# Patient Record
Sex: Female | Born: 1980 | Race: White | Hispanic: No | Marital: Single | Smoking: Current every day smoker
Health system: Southern US, Community
[De-identification: ages and names within clinical notes are randomized; demographics above are authoritative.]

---

## 2016-07-06 ENCOUNTER — Emergency Department (HOSPITAL_COMMUNITY)
Admission: EM | Admit: 2016-07-06 | Discharge: 2016-07-07 | Disposition: A | Discharge status: Home / Self Care | Payer: Self-pay | Attending: Emergency Medicine | Admitting: Emergency Medicine

## 2016-07-06 ENCOUNTER — Encounter (HOSPITAL_COMMUNITY): Payer: Self-pay | Admitting: Emergency Medicine

## 2016-07-06 DIAGNOSIS — F1120 Opioid dependence, uncomplicated: Secondary | ICD-10-CM

## 2016-07-06 DIAGNOSIS — F17210 Nicotine dependence, cigarettes, uncomplicated: Secondary | ICD-10-CM | POA: Insufficient documentation

## 2016-07-06 DIAGNOSIS — Z79899 Other long term (current) drug therapy: Secondary | ICD-10-CM | POA: Insufficient documentation

## 2016-07-06 DIAGNOSIS — R45851 Suicidal ideations: Secondary | ICD-10-CM

## 2016-07-06 LAB — COMPREHENSIVE METABOLIC PANEL
ALK PHOS: 47 U/L (ref 38–126)
ALT: 17 U/L (ref 14–54)
AST: 24 U/L (ref 15–41)
Albumin: 3.7 g/dL (ref 3.5–5.0)
Anion gap: 4 — ABNORMAL LOW (ref 5–15)
BILIRUBIN TOTAL: 0.4 mg/dL (ref 0.3–1.2)
BUN: 11 mg/dL (ref 6–20)
CALCIUM: 8.7 mg/dL — AB (ref 8.9–10.3)
CO2: 27 mmol/L (ref 22–32)
CREATININE: 0.71 mg/dL (ref 0.44–1.00)
Chloride: 108 mmol/L (ref 101–111)
GFR calc Af Amer: >60 mL/min (ref >60)
Glucose, Bld: 130 mg/dL — ABNORMAL HIGH (ref 65–99)
Potassium: 3.7 mmol/L (ref 3.5–5.1)
Sodium: 139 mmol/L (ref 135–145)
TOTAL PROTEIN: 7.2 g/dL (ref 6.5–8.1)

## 2016-07-06 LAB — CBC
HEMATOCRIT: 35.8 % — AB (ref 36.0–46.0)
Hemoglobin: 11.6 g/dL — ABNORMAL LOW (ref 12.0–15.0)
MCH: 29.5 pg (ref 26.0–34.0)
MCHC: 32.4 g/dL (ref 30.0–36.0)
MCV: 91.1 fL (ref 78.0–100.0)
PLATELETS: 289 10*3/uL (ref 150–400)
RBC: 3.93 MIL/uL (ref 3.87–5.11)
RDW: 14.7 % (ref 11.5–15.5)
WBC: 8.5 10*3/uL (ref 4.0–10.5)

## 2016-07-06 LAB — ACETAMINOPHEN LEVEL: Acetaminophen (Tylenol), Serum: <10 ug/mL — ABNORMAL LOW (ref 10–30)

## 2016-07-06 LAB — SALICYLATE LEVEL: Salicylate Lvl: <4 mg/dL (ref 2.8–30.0)

## 2016-07-06 LAB — ETHANOL

## 2016-07-06 MED ORDER — ZOLPIDEM TARTRATE 5 MG PO TABS
5.0000 mg | ORAL_TABLET | Freq: Every evening | ORAL | Status: DC | PRN
  Administered 2016-07-06: 5 mg via ORAL
  Filled 2016-07-06: qty 1

## 2016-07-06 MED ORDER — ACETAMINOPHEN 325 MG PO TABS: 650.0000 mg | ORAL_TABLET | ORAL | Status: DC | PRN

## 2016-07-06 MED ORDER — ALUM & MAG HYDROXIDE-SIMETH 200-200-20 MG/5ML PO SUSP: 30.0000 mL | ORAL | Status: DC | PRN

## 2016-07-06 MED ORDER — IBUPROFEN 200 MG PO TABS: 600.0000 mg | ORAL_TABLET | Freq: Three times a day (TID) | ORAL | Status: DC | PRN

## 2016-07-06 MED ORDER — ONDANSETRON 4 MG PO TBDP
4.0000 mg | ORAL_TABLET | Freq: Once | ORAL | Status: AC
  Administered 2016-07-06: 4 mg via ORAL
  Filled 2016-07-06: qty 1

## 2016-07-06 MED ORDER — NICOTINE 21 MG/24HR TD PT24: 21.0000 mg | MEDICATED_PATCH | Freq: Every day | TRANSDERMAL | Status: DC

## 2016-07-06 MED ORDER — ONDANSETRON HCL 4 MG PO TABS: 4.0000 mg | ORAL_TABLET | Freq: Three times a day (TID) | ORAL | Status: DC | PRN

## 2016-07-06 MED ORDER — NALOXONE HCL 2 MG/2ML IJ SOSY
1.0000 mg | PREFILLED_SYRINGE | Freq: Once | INTRAMUSCULAR | Status: AC
  Administered 2016-07-06: 1 mg via INTRAMUSCULAR
  Filled 2016-07-06: qty 2

## 2016-07-06 NOTE — ED Notes (Signed)
Bed: [facility] Expected date:  Expected time:  Means of arrival:  Comments: IVC 

## 2016-07-06 NOTE — BH Assessment (Addendum)
Assessment Note  Yvonne Wagner is an 35 y.o. female that presents this date under IVC. Patient was in treatment at [facility] for a year program that patient had completed 8 months of as ordered by probation prior to relapsing on opiates this date. Patient denies all questions on the assessment and is drowsy finding it difficult to answer any of the questions. No prior inpatient admission and no history of MH medication management. Per IVC: "Patient has been completely incoherent twice in the past 24 hours. The respondent is cooking some type of opiate in pill form and is injecting herself. [facility] removed two syringes that had a trace of a substance that she had used to inject the opiates. She is argumentive and indignant when question. She is under court order. She is a danger to herself." This Clinical research associate contacted [employer] (releases signed) and spoke to program director Cindi Loreta Ave [phone] who informed this Clinical research associate that patient was found with a belt around her arm and was injecting a unknown substance thought to be opiates. Loreta Ave stated patient was slow to interact with her and Wagner contact [facility]. Loreta Ave also informed this Clinical research associate that state probation [phone] was contacted and informed officer on call that patient had been admitted to [facility]. To note patient has signed a release of information at [facility] for [employer] to "find out what is going on." This Clinical research associate contacted Merchandiser, retail on call to verify [employer]. The rest of the information for the purposes of this assessment was obtained from admission note: "The patient is an unreliable historian. The history is gathered from the owner of her program. The patient is at [facility] under court order. The patient was found around 4 AM in the bathroom with a belt and around her arm and a needle in her right before meals. She lives with 5 other women who are in rehabilitation in a halfway house. The owner states  that they had to continually wake her up and walk around because she was falling asleep and having respiratory depression. They left around 6 AM before called back because around 7 a.m. when she was again found passed out on the floor. Her counselor states that she is afraid she will harm herself because she stated to one of the other patients, "this is my last day on earth."  Because the patient violated the agreements of her stay in a halfway house, she has to be terminated from the program, which means she will need to go directly to prison. The counselor at her facility is afraid that if her IVC paperwork is overturned and she is discharged she will kill herself. Her probation officer is not available today because it is a federal holiday.The patient denies suicidal ideation, homicidal ideation. She ideation or audiovisual hallucinations. She is somnolent and tells me that the reason is because she "couldn't sleep and had bad dreams." The patient also gives a convoluted and very difficult to follow story about being paraphernalia in the house that was not hers. She uses a evasive language and does not answer questions directly and was probed. The patient of note has track marks in her right AC". Case was staffed with Shaune Pollack DNP who recommended patient be re-evaluated in the a.m.        Diagnosis: Substance Abuse Mood D/O, Opiate use severe  Past Medical History: History reviewed. No pertinent past medical history.  History reviewed. No pertinent surgical history.  Family History: No family history on file.  Social History:  reports that she has been smoking E-cigarettes.  She has never used smokeless tobacco. She reports that she does not drink alcohol or use drugs.  Additional Social History:  Alcohol / Drug Use Pain Medications: See MAR Prescriptions: See MAR Over the Counter: See MAR History of alcohol / drug use?: Yes Longest period of sobriety (when/how long): 8 months current Negative  Consequences of Use: Legal Withdrawal Symptoms:  (none noted) Substance #1 Name of Substance 1: Heroin 1 - Age of First Use: 23 1 - Amount (size/oz): 2 grams 1 - Frequency: daily 1 - Duration: Currently denies because she is in treatment for the last 8 months  1 - Last Use / Amount: 8 months ago  CIWA: CIWA-Ar BP: 128/84 Pulse Rate: 76 COWS:    Allergies:  Allergies  Allergen Reactions  . Codeine     unknown    Home Medications:  (Not in a hospital admission)  OB/GYN Status:  No LMP recorded (lmp unknown).  General Assessment Data [facility] of [facility]: [facility] Assessment: In system Is this a Tele or Face-to-Face Assessment?: Face-to-Face Is this an Initial Assessment or a Re-assessment for this encounter?: Initial Assessment Marital status: Single Maiden name: na Is patient pregnant?: No Pregnancy Status: No Living Arrangements: Other (Comment) (treatment facility) Can pt return to current living arrangement?: No (per IVC) Admission Status: Involuntary Is patient capable of signing voluntary admission?: Yes Referral Source: Other (treatment center) Insurance type: No  Medical Screening Exam [facility] Walk-in ONLY) Medical Exam completed: Yes  Crisis Care Plan Living Arrangements: Other (Comment) (treatment facility) Legal Guardian:  (na) Name of Psychiatrist: None Name of Therapist: None  Education Status Is patient currently in school?: No Current Grade: na Highest grade of school patient has completed: 12 Name of school: na Contact person: na  Risk to self with the past 6 months Suicidal Ideation: No Has patient been a risk to self within the past 6 months prior to admission? : No Suicidal Intent: No Has patient had any suicidal intent within the past 6 months prior to admission? : No Is patient at risk for suicide?: No Suicidal Plan?: No Has patient had any suicidal plan within the past 6 months prior to admission? : No Access to Means: No What  has been your use of drugs/alcohol within the last 12 months?: past use Previous Attempts/Gestures: No How many times?: 0 Other Self Harm Risks: none Triggers for Past Attempts: Unknown Intentional Self Injurious Behavior: None Family Suicide History: No Recent stressful life event(s): Other (Comment) (pt is in SA treatment) Persecutory voices/beliefs?: No Depression: No Depression Symptoms:  (na) Substance abuse history and/or treatment for substance abuse?: Yes Suicide prevention information given to non-admitted patients: Not applicable  Risk to Others within the past 6 months Homicidal Ideation: No Does patient have any lifetime risk of violence toward others beyond the six months prior to admission? : No Thoughts of Harm to Others: No Current Homicidal Intent: No Current Homicidal Plan: No Access to Homicidal Means: No Identified Victim: na History of harm to others?: No Assessment of Violence: None Noted Violent Behavior Description: na Does patient have access to weapons?: No Criminal Charges Pending?: No Does patient have a court date: No Is patient on probation?: Yes  Psychosis Hallucinations: None noted Delusions: None noted  Mental Status Report Appearance/Hygiene: Unremarkable Eye Contact: Fair Motor Activity: Freedom of movement Speech: Soft Level of Consciousness: Drowsy Mood: Other (Comment) (very sleepy does not respond at times) Affect:  Labile Anxiety Level: Moderate Thought Processes: Coherent, Relevant Judgement: Partial Orientation: Person, Place, Time Obsessive Compulsive Thoughts/Behaviors: None  Cognitive Functioning Concentration: Decreased Memory: Recent Impaired IQ: Average Insight: Poor Impulse Control: Poor Appetite: Fair Weight Loss: 0 Weight Gain: 0 Sleep: Decreased Total Hours of Sleep: 6 Vegetative Symptoms: None  ADLScreening [facility] Assessment Services) Patient's cognitive ability adequate to safely complete daily  activities?: Yes Patient able to express need for assistance with ADLs?: Yes Independently performs ADLs?: Yes (appropriate for developmental age)  Prior Inpatient Therapy Prior Inpatient Therapy: Yes Prior Therapy Dates: 2017 Prior Therapy Facilty/Provider(s): [facility] Reason for Treatment: SA use  Prior Outpatient Therapy Prior Outpatient Therapy: Yes Prior Therapy Dates: 2015 [facility] Reason for Treatment: SA use Does patient have an ACCT team?: No Does patient have Intensive In-House Services?  : No Does patient have [facility] services? : No Does patient have P4CC services?: No  ADL Screening (condition at time of admission) Patient's cognitive ability adequate to safely complete daily activities?: Yes Is the patient deaf or have difficulty hearing?: No Does the patient have difficulty seeing, even when wearing glasses/contacts?: No Does the patient have difficulty concentrating, remembering, or making decisions?: No Patient able to express need for assistance with ADLs?: Yes Does the patient have difficulty dressing or bathing?: No Independently performs ADLs?: Yes (appropriate for developmental age) Does the patient have difficulty walking or climbing stairs?: No Weakness of Legs: None Weakness of Arms/Hands: None  Home Assistive Devices/Equipment Home Assistive Devices/Equipment: None  Therapy Consults (therapy consults require a physician order) PT Evaluation Needed: No OT Evalulation Needed: No SLP Evaluation Needed: No Abuse/Neglect Assessment (Assessment to be complete while patient is alone) Physical Abuse: Denies Verbal Abuse: Denies Sexual Abuse: Denies Exploitation of patient/patient's resources: Denies Self-Neglect: Denies Values / Beliefs Cultural Requests During Hospitalization: None Spiritual Requests During Hospitalization: None Consults Spiritual Care Consult Needed: No Social Work Consult Needed:  No Merchant navy officer (For Healthcare) Does patient have an advance directive?: No Would patient like information on creating an advanced directive?: No - patient declined information (pt declined)    Additional Information 1:1 In Past 12 Months?: No CIRT Risk: No Elopement Risk: No Does patient have medical clearance?: Yes     Disposition: Case was staffed with Shaune Pollack DNP who recommended patient be re-evaluated in the a.m.  Disposition Initial Assessment Completed for this Encounter: Yes Disposition of Patient: Inpatient treatment program Type of inpatient treatment program: Adult Other disposition(s): Other (Comment) (re-evaluate in the a.m.)  On Site Evaluation by:   Reviewed with Physician:    Alfredia Ferguson 07/06/2016 4:05 PM

## 2016-07-06 NOTE — ED Notes (Signed)
Pt's affect is guarded. She is cooperative. Denies SI/HI, AVH. Her only expressed concerned is that some friends be allowed to visit her.

## 2016-07-06 NOTE — ED Provider Notes (Signed)
[facility] DEPT Provider Note   CSN: [id number] Arrival date & time: 07/06/16  1225     History   Chief Complaint Chief Complaint  Patient presents with  . IVC    HPI Yvonne Wagner is a 35 y.o. female y/o F with a pmh of polysubstance abuse.  The patient is an unreliable historian. The history is gathered from the owner of her program. [facility]. The patient was found around 4 AM in the bathroom with a belt and around her arm and a needle in her right before meals. He lives with 5 other women who are in rehabilitation in a halfway house. The owner states that they had to continually wake her up and walk around because she was falling asleep and having respiratory depression. The left around 6 AM before called back because around 7 AM she was again Found passed out on the floor. Her counselor states that she is afraid she will harm herself because she stated to one of the other patients, "this is my last day on earth."  Because of the patient violated the agreements of her stay in a halfway house, She has to be terminated from the program, which means she will need to go directly to prison.  The counselor at her facility is afraid that if her IVC paperwork is overturned and she is discharged She will kill herself.  Her probation officer is not available today because it is a federal holiday.The patient denies suicidal ideation, homicidal ideation. She ideation or audiovisual hallucinations. She is somnolent and tells me that the reason is because she "couldn't sleep and had bad dreams." The patient also gives a convoluted and very difficult to follow story about being paraphernalia in the house that was not hers. She uses a evasive language and does not answer questions directly and was probed. The patient of note has track marks in her right [facility]. She tells me this is from an attempt at blood draw. I asked the nurse tech here if she drew blood from  this area and she states that she did not.   HPI  History reviewed. No pertinent past medical history.  There are no active problems to display for this patient.   History reviewed. No pertinent surgical history.  OB History    No data available       Home Medications    Prior to Admission medications   Medication Sig Start Date End Date Taking? Authorizing Provider  norgestimate-ethinyl estradiol (ORTHO-CYCLEN,SPRINTEC,PREVIFEM) 0.25-35 MG-MCG tablet Take 1 tablet by mouth daily.   Yes Historical Provider, MD    Family History No family history on file.  Social History Social History  Substance Use Topics  . Smoking status: Current Every Day Smoker    Types: E-cigarettes  . Smokeless tobacco: Never Used  . Alcohol use No     Allergies   Codeine   Review of Systems Review of Systems  Ten systems reviewed and are negative for acute change, except as noted in the HPI.    Physical Exam Updated Vital Signs BP 128/86 (BP Location: Left Arm)   Pulse 89   Temp 98.7 F (37.1 C) (Oral)   Ht 5\' 2"  (1.575 m)   Wt 59 kg   LMP  (LMP Unknown)   SpO2 100%   BMI 23.78 kg/m   Physical Exam  Constitutional: She is oriented to person, place, and time. She appears well-developed and well-nourished. No distress.  HENT:  Head: Normocephalic and atraumatic.  Eyes: Conjunctivae are normal. No scleral icterus.  Neck: Normal range of motion.  Cardiovascular: Normal rate, regular rhythm and normal heart sounds.  Exam reveals no gallop and no friction rub.   No murmur heard. Pulmonary/Chest: Effort normal and breath sounds normal. No respiratory distress.  Abdominal: Soft. Bowel sounds are normal. She exhibits no distension and no mass. There is no tenderness. There is no guarding.  Neurological: She is oriented to person, place, and time.  SOMNOLENT BUT EASILY AROUSED  Skin: Skin is warm and dry. She is not diaphoretic.  TRACK MARKS IN THE R AC     ED Treatments /  Results  Labs (all labs ordered are listed, but only abnormal results are displayed) Labs Reviewed  COMPREHENSIVE METABOLIC PANEL - Abnormal; Notable for the following:       Result Value   Glucose, Bld 130 (*)    Calcium 8.7 (*)    Anion gap 4 (*)    All other components within normal limits  ACETAMINOPHEN LEVEL - Abnormal; Notable for the following:    Acetaminophen (Tylenol), Serum <10 (*)    All other components within normal limits  CBC - Abnormal; Notable for the following:    Hemoglobin 11.6 (*)    HCT 35.8 (*)    All other components within normal limits  ETHANOL  SALICYLATE LEVEL  URINE RAPID DRUG SCREEN, HOSP PERFORMED  I-STAT BETA HCG BLOOD, ED ([facility], [facility], [facility] ONLY)  POC URINE PREG, ED    EKG  EKG Interpretation None       Radiology No results found.  Procedures Procedures (including critical care time)  Medications Ordered in ED Medications  ondansetron (ZOFRAN-ODT) disintegrating tablet 4 mg (not administered)  naloxone (NARCAN) injection 1 mg (not administered)  alum & mag hydroxide-simeth (MAALOX/MYLANTA) 200-200-20 MG/5ML suspension 30 mL (not administered)  ondansetron (ZOFRAN) tablet 4 mg (not administered)  ibuprofen (ADVIL,MOTRIN) tablet 600 mg (not administered)  acetaminophen (TYLENOL) tablet 650 mg (not administered)  nicotine (NICODERM CQ - dosed in mg/24 hours) patch 21 mg (not administered)  zolpidem (AMBIEN) tablet 5 mg (not administered)     Initial Impression / Assessment and Plan / ED Course  I have reviewed the triage vital signs and the nursing notes.  Pertinent labs & imaging results that were available during my care of the patient were reviewed by me and considered in my medical decision making (see chart for details).  Clinical Course    Patient appears medically clear for psych eval.  Final Clinical Impressions(s) / ED Diagnoses   Final diagnoses:  None    New Prescriptions New Prescriptions   No medications on file       Arthor Captain, PA-C 07/06/16 1512    Melene Plan, DO 07/06/16 1515

## 2016-07-06 NOTE — ED Notes (Signed)
Pt sleeping at present, no distress noted, calm & cooperative, A&O x 3.  Monitoring for safety, Q 15 min checks in effect.

## 2016-07-06 NOTE — BH Assessment (Addendum)
[facility] Assessment Progress Note   Case was staffed with Lord DNP who recommended patient be re-evaluated in the a.m.    

## 2016-07-06 NOTE — ED Notes (Signed)
Yvonne Wagner SAPU staff given report; pt belongings given to [facility] staff.

## 2016-07-06 NOTE — ED Triage Notes (Addendum)
Pt IVC by landlord related to opiate use stating "she is a danger to herself." Pt denies SI/HI or A/VH. Landlord en route to talk to ED staff.

## 2016-07-07 DIAGNOSIS — F1120 Opioid dependence, uncomplicated: Secondary | ICD-10-CM

## 2016-07-07 LAB — RAPID URINE DRUG SCREEN, HOSP PERFORMED
AMPHETAMINES: NOT DETECTED
Barbiturates: NOT DETECTED
Benzodiazepines: POSITIVE — AB
COCAINE: NOT DETECTED
OPIATES: POSITIVE — AB
Tetrahydrocannabinol: NOT DETECTED

## 2016-07-07 NOTE — [facility] Suicide Risk Assessment (Signed)
Suicide Risk Assessment  Discharge Assessment   [facility]   Principal Problem: Opiate dependence [facility](HCC) Discharge Diagnoses:  Patient Active Problem List   Diagnosis Date Noted  . Opiate dependence (HCC) [F11.20] 07/07/2016    Priority: High    Total Time spent with patient: 45 minutes  Musculoskeletal: Strength & Muscle Tone: within normal limits Gait & Station: normal Patient leans: N/A  Psychiatric Specialty Exam: Physical Exam  Constitutional: She is oriented to person, place, and time. She appears well-developed and well-nourished.  HENT:  Head: Normocephalic.  Neck: Normal range of motion.  Respiratory: Effort normal.  Musculoskeletal: Normal range of motion.  Neurological: She is alert and oriented to person, place, and time.  Skin: Skin is warm and dry.  Psychiatric: She has a normal mood and affect. Her speech is normal and behavior is normal. Judgment and thought content normal. Cognition and memory are normal.    Review of Systems  Constitutional: Negative.   HENT: Negative.   Eyes: Negative.   Respiratory: Negative.   Cardiovascular: Negative.   Gastrointestinal: Negative.   Genitourinary: Negative.   Musculoskeletal: Negative.   Skin: Negative.   Neurological: Negative.   Endo/Heme/Allergies: Negative.   Psychiatric/Behavioral: Positive for substance abuse.    Blood pressure 99/59, pulse 66, temperature 98.2 F (36.8 C), temperature source Oral, resp. rate 16, height 5\' 2"  (1.575 m), weight 59 kg (130 lb), SpO2 100 %.Body mass index is 23.78 kg/m.  General Appearance: Casual  Eye Contact:  Good  Speech:  Normal Rate  Volume:  Normal  Mood:  Euthymic  Affect:  Congruent  Thought Process:  Coherent and Descriptions of Associations: Intact  Orientation:  Full (Time, Place, and Person)  Thought Content:  WDL  Suicidal Thoughts:  No  Homicidal Thoughts:  No  Memory:  Immediate;   Good Recent;   Good Remote;   Good   Judgement:  Fair  Insight:  Fair  Psychomotor Activity:  Normal  Concentration:  Concentration: Good and Attention Span: Good  Recall:  Good  Fund of Knowledge:  Fair  Language:  Good  Akathisia:  No  Handed:  Right  AIMS (if indicated):     Assets:  Leisure Time Physical Health Resilience  ADL's:  Intact  Cognition:  WNL  Sleep:      Mental Status Per Nursing Assessment::   On Admission:   opiate abuse  Demographic Factors:  Caucasian  Loss Factors: Legal issues  Historical Factors: NA  Risk Reduction Factors:   Sense of responsibility to family, Living with another person, especially a relative, Positive social support and Positive therapeutic relationship  Continued Clinical Symptoms:  None   Cognitive Features That Contribute To Risk:  None    Suicide Risk:  Minimal: No identifiable suicidal ideation.  Patients presenting with no risk factors but with morbid ruminations; may be classified as minimal risk based on the severity of the depressive symptoms    Plan Of Care/Follow-up recommendations:  Activity:  as tolerated Diet:  heart healthy diet  LORD, JAMISON, NP 07/07/2016, 9:55 AM

## 2016-07-07 NOTE — ED Notes (Signed)
Pt discharged ambulatory with bus pass.  Pt was calm and cooperative and denied S/I and H/I.  All belongings were returned to pt.

## 2016-07-07 NOTE — Consult Note (Addendum)
[facility] Consult   Reason for Consult:  Heroin abuse Referring Physician:  EDP Patient Identification: Yvonne Wagner MRN:  [id number] Principal Diagnosis: Opiate dependence ([place]) Diagnosis:   Patient Active Problem List   Diagnosis Date Noted  . Opiate dependence ([place]) [F11.20] 07/07/2016    Priority: High    Total Time spent with patient: 45 minutes  Subjective:   Yvonne Wagner is a 35 y.o. female patient does not warrant admission.  HPI:  35 yo female who presented to the ED under IVC for abusing heroin at her "healing house."  She has been cooperative, calm, and pleasant.  [name] denies suicidal/homicidal ideations, hallucinations, and alcohol abuse.  Denies withdrawal symptoms and regrets her actions.  She had been clean for 8 months and someone new to the house brought drugs and she used the heroin.  Not interested in rehab at this time.  Past Psychiatric History: substance abuse  Risk to Self: Suicidal Ideation: No Suicidal Intent: No Is patient at risk for suicide?: No Suicidal Plan?: No Access to Means: No What has been your use of drugs/alcohol within the last 12 months?: past use How many times?: 0 Other Self Harm Risks: none Triggers for Past Attempts: Unknown Intentional Self Injurious Behavior: None Risk to Others: Homicidal Ideation: No Thoughts of Harm to Others: No Current Homicidal Intent: No Current Homicidal Plan: No Access to Homicidal Means: No Identified Victim: na History of harm to others?: No Assessment of Violence: None Noted Violent Behavior Description: na Does patient have access to weapons?: No Criminal Charges Pending?: No Does patient have a court date: No Prior Inpatient Therapy: Prior Inpatient Therapy: Yes Prior Therapy Dates: 2017 Prior Therapy Facilty/Provider(s): [facility] Reason for Treatment: SA use Prior Outpatient Therapy: Prior Outpatient Therapy: Yes Prior Therapy Dates: 2015 Prior Therapy  Facilty/Provider(s): [facility] Reason for Treatment: SA use Does patient have an ACCT team?: No Does patient have Intensive In-House Services?  : No Does patient have [facility] services? : No Does patient have P4[facility] services?: No  Past Medical History: History reviewed. No pertinent past medical history. History reviewed. No pertinent surgical history. Family History: No family history on file. Family Psychiatric  History: none Social History:  History  Alcohol Use No     History  Drug Use No    Social History   Social History  . Marital status: Single    Spouse name: N/A  . Number of children: N/A  . Years of education: N/A   Social History Main Topics  . Smoking status: Current Every Day Smoker    Types: E-cigarettes  . Smokeless tobacco: Never Used  . Alcohol use No  . Drug use: No  . Sexual activity: Not Asked   Other Topics Concern  . None   Social History Narrative  . None   Additional Social History:    Allergies:   Allergies  Allergen Reactions  . Codeine     unknown    Labs:  Results for orders placed or performed during the hospital encounter of 07/06/16 (from the past 48 hour(s))  Comprehensive metabolic panel     Status: Abnormal   Collection Time: 07/06/16 12:59 PM  Result Value Ref Range   Sodium 139 135 - 145 mmol/L   Potassium 3.7 3.5 - 5.1 mmol/L   Chloride 108 101 - 111 mmol/L   CO2 27 22 - 32 mmol/L   Glucose, Bld 130 (H) 65 - 99 mg/dL   BUN 11 6 - 20 mg/dL  Creatinine, Ser 0.71 0.44 - 1.00 mg/dL   Calcium 8.7 (L) 8.9 - 10.3 mg/dL   Total Protein 7.2 6.5 - 8.1 g/dL   Albumin 3.7 3.5 - 5.0 g/dL   AST 24 15 - 41 U/L   ALT 17 14 - 54 U/L   Alkaline Phosphatase 47 38 - 126 U/L   Total Bilirubin 0.4 0.3 - 1.2 mg/dL   GFR calc non Af Amer >60 >60 mL/min   GFR calc Af Amer >60 >60 mL/min    Comment: (NOTE) The eGFR has been calculated using the CKD EPI equation. This calculation has not been validated in all clinical  situations. eGFR's persistently <60 mL/min signify possible Chronic Kidney Disease.    Anion gap 4 (L) 5 - 15  cbc     Status: Abnormal   Collection Time: 07/06/16 12:59 PM  Result Value Ref Range   WBC 8.5 4.0 - 10.5 K/uL   RBC 3.93 3.87 - 5.11 MIL/uL   Hemoglobin 11.6 (L) 12.0 - 15.0 g/dL   HCT 91.1 (L) 77.0 - 19.3 %   MCV 91.1 78.0 - 100.0 fL   MCH 29.5 26.0 - 34.0 pg   MCHC 32.4 30.0 - 36.0 g/dL   RDW 96.6 57.8 - 14.0 %   Platelets 289 150 - 400 K/uL  Ethanol     Status: None   Collection Time: 07/06/16  1:10 PM  Result Value Ref Range   Alcohol, Ethyl (B) <5 <5 mg/dL    Comment:        LOWEST DETECTABLE LIMIT FOR SERUM ALCOHOL IS 5 mg/dL FOR MEDICAL PURPOSES ONLY   Salicylate level     Status: None   Collection Time: 07/06/16  1:10 PM  Result Value Ref Range   Salicylate Lvl <4.0 2.8 - 30.0 mg/dL  Acetaminophen level     Status: Abnormal   Collection Time: 07/06/16  1:10 PM  Result Value Ref Range   Acetaminophen (Tylenol), Serum <10 (L) 10 - 30 ug/mL    Comment:        THERAPEUTIC CONCENTRATIONS VARY SIGNIFICANTLY. A RANGE OF 10-30 ug/mL MAY BE AN EFFECTIVE CONCENTRATION FOR MANY PATIENTS. HOWEVER, SOME ARE BEST TREATED AT CONCENTRATIONS OUTSIDE THIS RANGE. ACETAMINOPHEN CONCENTRATIONS >150 ug/mL AT 4 HOURS AFTER INGESTION AND >50 ug/mL AT 12 HOURS AFTER INGESTION ARE OFTEN ASSOCIATED WITH TOXIC REACTIONS.   Rapid urine drug screen (hospital performed)     Status: Abnormal   Collection Time: 07/07/16  9:16 AM  Result Value Ref Range   Opiates POSITIVE (A) NONE DETECTED   Cocaine NONE DETECTED NONE DETECTED   Benzodiazepines POSITIVE (A) NONE DETECTED   Amphetamines NONE DETECTED NONE DETECTED   Tetrahydrocannabinol NONE DETECTED NONE DETECTED   Barbiturates NONE DETECTED NONE DETECTED    Comment:        DRUG SCREEN FOR MEDICAL PURPOSES ONLY.  IF CONFIRMATION IS NEEDED FOR ANY PURPOSE, NOTIFY LAB WITHIN 5 DAYS.        LOWEST DETECTABLE LIMITS FOR  URINE DRUG SCREEN Drug Class       Cutoff (ng/mL) Amphetamine      1000 Barbiturate      200 Benzodiazepine   200 Tricyclics       300 Opiates          300 Cocaine          300 THC              50     Current Facility-Administered Medications  Medication  Dose Route Frequency Provider Last Rate Last Dose  . acetaminophen (TYLENOL) tablet 650 mg  650 mg Oral Q4H PRN Margarita Mail, PA-C      . alum & mag hydroxide-simeth (MAALOX/MYLANTA) 200-200-20 MG/5ML suspension 30 mL  30 mL Oral PRN Margarita Mail, PA-C      . ibuprofen (ADVIL,MOTRIN) tablet 600 mg  600 mg Oral Q8H PRN Margarita Mail, PA-C      . nicotine (NICODERM CQ - dosed in mg/24 hours) patch 21 mg  21 mg Transdermal Daily Abigail Harris, PA-C      . ondansetron (ZOFRAN) tablet 4 mg  4 mg Oral Q8H PRN Margarita Mail, PA-C      . zolpidem (AMBIEN) tablet 5 mg  5 mg Oral QHS PRN Margarita Mail, PA-C   5 mg at 07/06/16 2149   Current Outpatient Prescriptions  Medication Sig Dispense Refill  . norgestimate-ethinyl estradiol (ORTHO-CYCLEN,SPRINTEC,PREVIFEM) 0.25-35 MG-MCG tablet Take 1 tablet by mouth daily.      Musculoskeletal: Strength & Muscle Tone: within normal limits Gait & Station: normal Patient leans: N/A  Psychiatric Specialty Exam: Physical Exam  Constitutional: She is oriented to person, place, and time. She appears well-developed and well-nourished.  HENT:  Head: Normocephalic.  Neck: Normal range of motion.  Respiratory: Effort normal.  Musculoskeletal: Normal range of motion.  Neurological: She is alert and oriented to person, place, and time.  Skin: Skin is warm and dry.  Psychiatric: She has a normal mood and affect. Her speech is normal and behavior is normal. Judgment and thought content normal. Cognition and memory are normal.    Review of Systems  Constitutional: Negative.   HENT: Negative.   Eyes: Negative.   Respiratory: Negative.   Cardiovascular: Negative.   Gastrointestinal: Negative.    Genitourinary: Negative.   Musculoskeletal: Negative.   Skin: Negative.   Neurological: Negative.   Endo/Heme/Allergies: Negative.   Psychiatric/Behavioral: Positive for substance abuse.    Blood pressure 99/59, pulse 66, temperature 98.2 F (36.8 C), temperature source Oral, resp. rate 16, height '5\' 2"'$  (1.575 m), weight 59 kg (130 lb), SpO2 100 %.Body mass index is 23.78 kg/m.  General Appearance: Casual  Eye Contact:  Good  Speech:  Normal Rate  Volume:  Normal  Mood:  Euthymic  Affect:  Congruent  Thought Process:  Coherent and Descriptions of Associations: Intact  Orientation:  Full (Time, Place, and Person)  Thought Content:  WDL  Suicidal Thoughts:  No  Homicidal Thoughts:  No  Memory:  Immediate;   Good Recent;   Good Remote;   Good  Judgement:  Fair  Insight:  Fair  Psychomotor Activity:  Normal  Concentration:  Concentration: Good and Attention Span: Good  Recall:  Good  Fund of Knowledge:  Fair  Language:  Good  Akathisia:  No  Handed:  Right  AIMS (if indicated):     Assets:  Leisure Time Physical Health Resilience  ADL's:  Intact  Cognition:  WNL  Sleep:        Treatment Plan Summary: Daily contact with patient to assess and evaluate symptoms and progress in treatment, Medication management and Plan opiate dependence:  -Crisis stabilization -Medication management:  PRN medications provided -Individual and substance abuse counseling  Disposition: No evidence of imminent risk to self or others at present.    Waylan Boga, NP 07/07/2016 9:50 AM  Patient seen face-to-face for psychiatric evaluation, chart reviewed and case discussed with the physician extender and developed treatment plan. Reviewed the information documented and agree  with the treatment plan. Corena Pilgrim, MD

## 2016-12-11 ENCOUNTER — Encounter: Payer: Self-pay | Admitting: Family Medicine

## 2016-12-11 ENCOUNTER — Ambulatory Visit: Payer: Self-pay | Admitting: Family Medicine

## 2016-12-11 ENCOUNTER — Ambulatory Visit: Payer: Self-pay | Attending: Family Medicine | Admitting: Family Medicine

## 2016-12-11 VITALS — BP 130/82 | HR 89 | Temp 98.3°F | Resp 18 | Ht 62.0 in | Wt 150.6 lb

## 2016-12-11 DIAGNOSIS — R200 Anesthesia of skin: Secondary | ICD-10-CM | POA: Insufficient documentation

## 2016-12-11 DIAGNOSIS — Z23 Encounter for immunization: Secondary | ICD-10-CM | POA: Insufficient documentation

## 2016-12-11 DIAGNOSIS — M62838 Other muscle spasm: Secondary | ICD-10-CM | POA: Insufficient documentation

## 2016-12-11 DIAGNOSIS — R202 Paresthesia of skin: Secondary | ICD-10-CM | POA: Insufficient documentation

## 2016-12-11 MED ORDER — CYCLOBENZAPRINE HCL 10 MG PO TABS: 10.0000 mg | ORAL_TABLET | Freq: Three times a day (TID) | ORAL | 0 refills | Status: AC | PRN

## 2016-12-11 MED ORDER — GABAPENTIN 300 MG PO CAPS: 300.0000 mg | ORAL_CAPSULE | Freq: Every day | ORAL | 2 refills | Status: DC

## 2016-12-11 MED FILL — CYCLOBENZAPRINE 10 MG TAB: 10 | 30 days supply | Qty: 60 | Fill #0

## 2016-12-11 MED FILL — GABAPENTIN 300 MG CAPSULE: 300 | 30 days supply | Qty: 30 | Fill #0

## 2016-12-11 NOTE — Patient Instructions (Addendum)
Gabapentin capsules or tablets What is this medicine? GABAPENTIN (GA ba pen tin) is used to control partial seizures in adults with epilepsy. It is also used to treat certain types of nerve pain. This medicine may be used for other purposes; ask your health care provider or pharmacist if you have questions. COMMON BRAND NAME(S): Active-PAC with Gabapentin, Gabarone, Neurontin What should I tell my health care provider before I take this medicine? They need to know if you have any of these conditions: -kidney disease -suicidal thoughts, plans, or attempt; a previous suicide attempt by you or a family member -an unusual or allergic reaction to gabapentin, other medicines, foods, dyes, or preservatives -pregnant or trying to get pregnant -breast-feeding How should I use this medicine? Take this medicine by mouth with a glass of water. Follow the directions on the prescription label. You can take it with or without food. If it upsets your stomach, take it with food.Take your medicine at regular intervals. Do not take it more often than directed. Do not stop taking except on your doctor's advice. If you are directed to break the 600 or 800 mg tablets in half as part of your dose, the extra half tablet should be used for the next dose. If you have not used the extra half tablet within 28 days, it should be thrown away. A special MedGuide will be given to you by the pharmacist with each prescription and refill. Be sure to read this information carefully each time. Talk to your pediatrician regarding the use of this medicine in children. Special care may be needed. Overdosage: If you think you have taken too much of this medicine contact a poison control center or emergency room at once. NOTE: This medicine is only for you. Do not share this medicine with others. What if I miss a dose? If you miss a dose, take it as soon as you can. If it is almost time for your next dose, take only that dose. Do not  take double or extra doses. What may interact with this medicine? Do not take this medicine with any of the following medications: -other gabapentin products This medicine may also interact with the following medications: -alcohol -antacids -antihistamines for allergy, cough and cold -certain medicines for anxiety or sleep -certain medicines for depression or psychotic disturbances -homatropine; hydrocodone -naproxen -narcotic medicines (opiates) for pain -phenothiazines like chlorpromazine, mesoridazine, prochlorperazine, thioridazine This list may not describe all possible interactions. Give your health care provider a list of all the medicines, herbs, non-prescription drugs, or dietary supplements you use. Also tell them if you smoke, drink alcohol, or use illegal drugs. Some items may interact with your medicine. What should I watch for while using this medicine? Visit your doctor or health care professional for regular checks on your progress. You may want to keep a record at home of how you feel your condition is responding to treatment. You may want to share this information with your doctor or health care professional at each visit. You should contact your doctor or health care professional if your seizures get worse or if you have any new types of seizures. Do not stop taking this medicine or any of your seizure medicines unless instructed by your doctor or health care professional. Stopping your medicine suddenly can increase your seizures or their severity. Wear a medical identification bracelet or chain if you are taking this medicine for seizures, and carry a card that lists all your medications. You may get drowsy, dizzy,   or have blurred vision. Do not drive, use machinery, or do anything that needs mental alertness until you know how this medicine affects you. To reduce dizzy or fainting spells, do not sit or stand up quickly, especially if you are an older patient. Alcohol can  increase drowsiness and dizziness. Avoid alcoholic drinks. Your mouth may get dry. Chewing sugarless gum or sucking hard candy, and drinking plenty of water will help. The use of this medicine may increase the chance of suicidal thoughts or actions. Pay special attention to how you are responding while on this medicine. Any worsening of mood, or thoughts of suicide or dying should be reported to your health care professional right away. Women who become pregnant while using this medicine may enroll in the [place] American Antiepileptic Drug Pregnancy Registry by calling [phone]. This registry collects information about the safety of antiepileptic drug use during pregnancy. What side effects may I notice from receiving this medicine? Side effects that you should report to your doctor or health care professional as soon as possible: -allergic reactions like skin rash, itching or hives, swelling of the face, lips, or tongue -worsening of mood, thoughts or actions of suicide or dying Side effects that usually do not require medical attention (report to your doctor or health care professional if they continue or are bothersome): -constipation -difficulty walking or controlling muscle movements -dizziness -nausea -slurred speech -tiredness -tremors -weight gain This list may not describe all possible side effects. Call your doctor for medical advice about side effects. You may report side effects to FDA at 1-800-FDA-1088. Where should I keep my medicine? Keep out of reach of children. This medicine may cause accidental overdose and death if it taken by other adults, children, or pets. Mix any unused medicine with a substance like cat litter or coffee grounds. Then throw the medicine away in a sealed container like a sealed bag or a coffee can with a lid. Do not use the medicine after the expiration date. Store at room temperature between 15 and 30 degrees C (59 and 86 degrees F). NOTE: This  sheet is a summary. It may not cover all possible information. If you have questions about this medicine, talk to your doctor, pharmacist, or health care provider.  2017 Elsevier/Gold Standard (2013-12-15 15:26:50)   Musculoskeletal Pain Musculoskeletal pain is muscle and bone aches and pains. This pain can occur in any part of the body. Follow these instructions at home:  Only take medicines for pain, discomfort, or fever as told by your health care provider.  You may continue all activities unless the activities cause more pain. When the pain lessens, slowly resume normal activities. Gradually increase the intensity and duration of the activities or exercise.  During periods of severe pain, bed rest may be helpful. Lie or sit in any position that is comfortable, but get out of bed and walk around at least every several hours.  If directed, put ice on the injured area.  Put ice in a plastic bag.  Place a towel between your skin and the bag.  Leave the ice on for 20 minutes, 2-3 times a day. Contact a health care provider if:  Your pain is getting worse.  Your pain is not relieved with medicines.  You lose function in the area of the pain if the pain is in your arms, legs, or neck. This information is not intended to replace advice given to you by your health care provider. Make sure you discuss any questions  you have with your health care provider. Document Released: 10/19/2005 Document Revised: 03/31/2016 Document Reviewed: 06/23/2013 Elsevier Interactive Patient Education  2017 [employer]
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
## 2016-12-14 ENCOUNTER — Emergency Department (HOSPITAL_BASED_OUTPATIENT_CLINIC_OR_DEPARTMENT_OTHER): Payer: Self-pay

## 2016-12-14 ENCOUNTER — Emergency Department (HOSPITAL_BASED_OUTPATIENT_CLINIC_OR_DEPARTMENT_OTHER)
Admission: EM | Admit: 2016-12-14 | Discharge: 2016-12-14 | Disposition: A | Discharge status: Home / Self Care | Payer: Self-pay | Attending: Emergency Medicine | Admitting: Emergency Medicine

## 2016-12-14 ENCOUNTER — Encounter (HOSPITAL_BASED_OUTPATIENT_CLINIC_OR_DEPARTMENT_OTHER): Payer: Self-pay | Admitting: *Deleted

## 2016-12-14 DIAGNOSIS — F17210 Nicotine dependence, cigarettes, uncomplicated: Secondary | ICD-10-CM | POA: Insufficient documentation

## 2016-12-14 DIAGNOSIS — J101 Influenza due to other identified influenza virus with other respiratory manifestations: Secondary | ICD-10-CM | POA: Insufficient documentation

## 2016-12-14 LAB — URINALYSIS, ROUTINE W REFLEX MICROSCOPIC
Bilirubin Urine: NEGATIVE
GLUCOSE, UA: NEGATIVE mg/dL
Ketones, ur: NEGATIVE mg/dL
LEUKOCYTES UA: NEGATIVE
Nitrite: NEGATIVE
PROTEIN: NEGATIVE mg/dL
SPECIFIC GRAVITY, URINE: 1.006 (ref 1.005–1.030)
pH: 6.5 (ref 5.0–8.0)

## 2016-12-14 LAB — URINALYSIS, MICROSCOPIC (REFLEX)

## 2016-12-14 LAB — I-STAT CG4 LACTIC ACID, ED: LACTIC ACID, VENOUS: 1.73 mmol/L (ref 0.5–1.9)

## 2016-12-14 MED ORDER — ACETAMINOPHEN 500 MG PO TABS
1000.0000 mg | ORAL_TABLET | Freq: Once | ORAL | Status: AC
  Administered 2016-12-14: 1000 mg via ORAL
  Filled 2016-12-14: qty 2

## 2016-12-14 MED ORDER — IBUPROFEN 800 MG PO TABS
800.0000 mg | ORAL_TABLET | Freq: Once | ORAL | Status: AC
  Administered 2016-12-14: 800 mg via ORAL
  Filled 2016-12-14: qty 1

## 2016-12-14 NOTE — ED Triage Notes (Signed)
Fever, cough, body aches, headache x 2 days. She took Ibuprofen 800mg  an hour ago.
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
## 2016-12-15 ENCOUNTER — Telehealth: Payer: Self-pay | Admitting: Family Medicine

## 2016-12-15 ENCOUNTER — Other Ambulatory Visit: Payer: Self-pay | Admitting: Family Medicine

## 2016-12-15 DIAGNOSIS — R202 Paresthesia of skin: Principal | ICD-10-CM

## 2016-12-15 DIAGNOSIS — R200 Anesthesia of skin: Secondary | ICD-10-CM

## 2016-12-15 MED ORDER — GABAPENTIN 300 MG PO CAPS: 300.0000 mg | ORAL_CAPSULE | Freq: Two times a day (BID) | ORAL | 1 refills | Status: DC

## 2016-12-15 MED FILL — GABAPENTIN 300 MG CAPSULE: 300 | 30 days supply | Qty: 60 | Fill #0

## 2016-12-15 NOTE — Telephone Encounter (Signed)
Pt. Called requesting to speak with her nurse. Pt. Was prescribed gabapentin (NEURONTIN) 300 MG capsule  And cyclobenzaprine (FLEXERIL) 10 MG tablet on her last OV. Pt. Has concerns regarding her flexeril.  Please f/u with pt.

## 2016-12-15 NOTE — Telephone Encounter (Signed)
Patient stated that the gabapentin was told to take it once a day but she feel that its not helping through out the day like at 1 pm her symptoms comes back and if she takes more than 1 pill a day it wont last her supply, also the flexeril makes her drowsy/fatigue the patient does take it with food, so the patient don't know what to do  please advice?

## 2016-12-15 NOTE — Telephone Encounter (Signed)
CMA call to inform that the provider increase her dose of medication and to decrease her other medication  Patient was aware and understood

## 2016-12-15 NOTE — Telephone Encounter (Signed)
Gabapentin dose has been increased to 300 mg twice a day. Refills will be available. Flexeril is a muscle relaxant. It is a common side effect for muscle relaxants to cause drowsiness. Flexeril should be taken 3 times a day as needed. She can decrease the amount of times per day she takes it or she can take it daily as needed at bedtime.

## 2016-12-21 ENCOUNTER — Ambulatory Visit: Payer: Self-pay | Attending: Family Medicine

## 2016-12-29 ENCOUNTER — Encounter (HOSPITAL_COMMUNITY): Payer: Self-pay

## 2016-12-29 ENCOUNTER — Emergency Department (HOSPITAL_COMMUNITY)
Admission: EM | Admit: 2016-12-29 | Discharge: 2016-12-29 | Disposition: A | Discharge status: Home / Self Care | Payer: Self-pay | Attending: Emergency Medicine | Admitting: Emergency Medicine

## 2016-12-29 DIAGNOSIS — F17290 Nicotine dependence, other tobacco product, uncomplicated: Secondary | ICD-10-CM | POA: Insufficient documentation

## 2016-12-29 DIAGNOSIS — T402X1A Poisoning by other opioids, accidental (unintentional), initial encounter: Secondary | ICD-10-CM | POA: Insufficient documentation

## 2016-12-29 DIAGNOSIS — T40601A Poisoning by unspecified narcotics, accidental (unintentional), initial encounter: Secondary | ICD-10-CM

## 2016-12-29 LAB — I-STAT CHEM 8, ED
BUN: 4 mg/dL — ABNORMAL LOW (ref 6–20)
Calcium, Ion: 1.03 mmol/L — ABNORMAL LOW (ref 1.15–1.40)
Chloride: 105 mmol/L (ref 101–111)
Creatinine, Ser: 0.6 mg/dL (ref 0.44–1.00)
Glucose, Bld: 105 mg/dL — ABNORMAL HIGH (ref 65–99)
HCT: 37 % (ref 36.0–46.0)
Hemoglobin: 12.6 g/dL (ref 12.0–15.0)
Potassium: 3.6 mmol/L (ref 3.5–5.1)
Sodium: 142 mmol/L (ref 135–145)
TCO2: 24 mmol/L (ref 0–100)

## 2016-12-29 LAB — I-STAT BETA HCG BLOOD, ED (MC, WL, AP ONLY): I-stat hCG, quantitative: <5 m[IU]/mL

## 2016-12-29 NOTE — ED Notes (Signed)
Pt states blood was drawn by phlebotomy just not clicked off in computer yet.

## 2016-12-29 NOTE — ED Notes (Signed)
Pt ambulatory to restroom

## 2016-12-29 NOTE — ED Notes (Signed)
Pt stable, ambulatory, states understanding of discharge instructions 

## 2016-12-29 NOTE — ED Triage Notes (Signed)
Pt arrived via [facility] c/o overdose. 5 minutes of CPR performed by bystander.  Pt states she consumed unknown amount of "little blue pills".  EMS gave 0.5mg  Narcan.
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
## 2017-01-04 ENCOUNTER — Other Ambulatory Visit: Payer: Self-pay

## 2017-01-04 ENCOUNTER — Encounter: Payer: Self-pay | Admitting: Family Medicine

## 2017-01-04 ENCOUNTER — Ambulatory Visit: Payer: Self-pay | Attending: Family Medicine | Admitting: Family Medicine

## 2017-01-04 VITALS — BP 130/83 | HR 110 | Temp 98.1°F | Resp 18 | Ht 62.0 in | Wt 147.2 lb

## 2017-01-04 DIAGNOSIS — R0602 Shortness of breath: Secondary | ICD-10-CM | POA: Insufficient documentation

## 2017-01-04 DIAGNOSIS — X58XXXA Exposure to other specified factors, initial encounter: Secondary | ICD-10-CM | POA: Insufficient documentation

## 2017-01-04 DIAGNOSIS — F419 Anxiety disorder, unspecified: Secondary | ICD-10-CM

## 2017-01-04 DIAGNOSIS — F17200 Nicotine dependence, unspecified, uncomplicated: Secondary | ICD-10-CM | POA: Insufficient documentation

## 2017-01-04 DIAGNOSIS — Z87898 Personal history of other specified conditions: Secondary | ICD-10-CM

## 2017-01-04 DIAGNOSIS — I10 Essential (primary) hypertension: Secondary | ICD-10-CM | POA: Insufficient documentation

## 2017-01-04 DIAGNOSIS — T50991A Poisoning by other drugs, medicaments and biological substances, accidental (unintentional), initial encounter: Secondary | ICD-10-CM | POA: Insufficient documentation

## 2017-01-04 DIAGNOSIS — R002 Palpitations: Secondary | ICD-10-CM | POA: Insufficient documentation

## 2017-01-04 DIAGNOSIS — F17290 Nicotine dependence, other tobacco product, uncomplicated: Secondary | ICD-10-CM

## 2017-01-04 DIAGNOSIS — Z9189 Other specified personal risk factors, not elsewhere classified: Secondary | ICD-10-CM

## 2017-01-04 LAB — BASIC METABOLIC PANEL WITH GFR
BUN: 9 mg/dL (ref 7–25)
CALCIUM: 9.6 mg/dL (ref 8.6–10.2)
CO2: 23 mmol/L (ref 20–31)
Chloride: 105 mmol/L (ref 98–110)
Creat: 0.61 mg/dL (ref 0.50–1.10)
GLUCOSE: 96 mg/dL (ref 65–99)
Potassium: 4 mmol/L (ref 3.5–5.3)
Sodium: 139 mmol/L (ref 135–146)

## 2017-01-04 LAB — LIPID PANEL
CHOLESTEROL: 161 mg/dL (ref <200)
HDL: 42 mg/dL — AB (ref >50)
LDL Cholesterol: 86 mg/dL (ref <100)
Total CHOL/HDL Ratio: 3.8 Ratio (ref <5.0)
Triglycerides: 167 mg/dL — ABNORMAL HIGH (ref <150)
VLDL: 33 mg/dL — ABNORMAL HIGH (ref <30)

## 2017-01-04 MED ORDER — HYDROCHLOROTHIAZIDE 12.5 MG PO TABS: 12.5000 mg | ORAL_TABLET | Freq: Every day | ORAL | 2 refills | Status: DC

## 2017-01-04 MED FILL — ?HYDROCHLOROTHIAZIDE 12.5MG: 12.5 | 30 days supply | Qty: 30 | Fill #0

## 2017-01-04 NOTE — Progress Notes (Signed)
Subjective:  Patient ID: Yvonne Wagner, female    DOB: 02/19/1981  Age: 36 y.o. MRN: [id number]  CC: Establish Care   HPI Yvonne GoodnightCaseie Bou presents for   ED f/u overdose: Denies any SI/HI. Decline to speak with LCSW at this time. Reports regular counseling sessions at current drug treatment facility.Denies thoughts of harming herself or other   Intermitment SOB and palpitations: Started this week. Denies any CP. Reports shortness of breath begins in the middle of the night, or when she begins devotions in the morning. Current smoker reports using vapes with 0.3 mg of nicotine. She reports not being ready to quit at this time. BP also found to be elevated this visit. She is currently on oral contraceptives and refuses other birth control options at this time.   Outpatient Medications Prior to Visit  Medication Sig Dispense Refill  . gabapentin (NEURONTIN) 300 MG capsule Take 1 capsule (300 mg total) by mouth 2 (two) times daily. 60 capsule 1  . norgestimate-ethinyl estradiol (ORTHO-CYCLEN,SPRINTEC,PREVIFEM) 0.25-35 MG-MCG tablet Take 1 tablet by mouth daily.    . cyclobenzaprine (FLEXERIL) 10 MG tablet Take 1 tablet (10 mg total) by mouth 3 (three) times daily as needed for muscle spasms. (Patient not taking: Reported on 01/04/2017) 60 tablet 0   No facility-administered medications prior to visit.     ROS Review of Systems  Respiratory: Negative.   Cardiovascular: Negative.   Gastrointestinal: Negative.   Neurological: Negative.   Psychiatric/Behavioral: Negative.        History of opiate abuse.        Objective:  BP 130/83   Pulse (!) 110   Temp 98.1 F (36.7 C) (Oral)   Resp 18   Ht 5\' 2"  (1.575 m)   Wt 147 lb 3.2 oz (66.8 kg)   LMP 12/06/2016   SpO2 99%   BMI 26.92 kg/m   BP/Weight 01/04/2017 12/29/2016 12/14/2016  Systolic BP 130 131 140  Diastolic BP 83 75 96  Wt. (Lbs) 147.2 - 140  BMI 26.92 - 25.61     Physical Exam  Cardiovascular: Normal rate, regular  rhythm, normal heart sounds and intact distal pulses.   Pulmonary/Chest: Effort normal and breath sounds normal.  Abdominal: Soft. Bowel sounds are normal.  Skin: Skin is warm and dry.  Psychiatric: She expresses no homicidal and no suicidal ideation. She expresses no suicidal plans and no homicidal plans.  Nursing note and vitals reviewed.    Depression screen [facility] 2/9 01/04/2017 12/11/2016  Decreased Interest 0 0  Down, Depressed, Hopeless 0 0  PHQ - 2 Score 0 0  Altered sleeping 1 0  Tired, decreased energy 0 0  Change in appetite 0 0  Feeling bad or failure about yourself  0 0  Trouble concentrating 0 0  Moving slowly or fidgety/restless 0 0  Suicidal thoughts 0 0  PHQ-9 Score 1 0   GAD 7 : Generalized Anxiety Score 01/04/2017 12/11/2016  Nervous, Anxious, on Edge 0 0  Control/stop worrying 0 0  Worry too much - different things 0 0  Trouble relaxing 0 0  Restless 0 0  Easily annoyed or irritable 0 0  Afraid - awful might happen 0 0  Total GAD 7 Score 0 0     Assessment & Plan:   Problem List Items Addressed This Visit    None    Visit Diagnoses    History of drug overdose    -  Primary   Relevant Orders   EKG  12-Lead   History of palpitations       Relevant Orders   EKG 12-Lead   Essential hypertension       Relevant Medications   hydrochlorothiazide (HYDRODIURIL) 12.5 MG tablet   Other Relevant Orders   Microalbumin/Creatinine Ratio, Urine (Completed)   BASIC METABOLIC PANEL WITH GFR (Completed)   Lipid Panel (Completed)   Other tobacco product nicotine dependence, uncomplicated       Anxious mood             -Based on recent history of drug overdose and evaluation of symptoms , patient symptoms are very           likely to be attributed to anxiety.        -She declines speaking with LCSW.      Meds ordered this encounter  Medications  . hydrochlorothiazide (HYDRODIURIL) 12.5 MG tablet    Sig: Take 1 tablet (12.5 mg total) by mouth daily.    Dispense:  30  tablet    Refill:  2    Order Specific Question:   Supervising Provider    Answer:   Quentin Angst [id number]    Follow-up: Return in about 2 weeks (around 01/18/2017) for BP check with nurse.  Lizbeth Bark FNP

## 2017-01-04 NOTE — Progress Notes (Signed)
Patient is here for F/up ED opiate overdose  patient wants to see if she gets referral to a cardiology   Patient complains about heart beating harder

## 2017-01-04 NOTE — Patient Instructions (Addendum)
Come back in 2 week for BP check with nurse.  Hydrochlorothiazide, HCTZ capsules or tablets What is this medicine? HYDROCHLOROTHIAZIDE (hye droe klor oh THYE a zide) is a diuretic. It increases the amount of urine passed, which causes the body to lose salt and water. This medicine is used to treat high blood pressure. It is also reduces the swelling and water retention caused by various medical conditions, such as heart, liver, or kidney disease. This medicine may be used for other purposes; ask your health care provider or pharmacist if you have questions. COMMON BRAND NAME(S): Esidrix, Ezide, HydroDIURIL, Microzide, Oretic, Zide What should I tell my health care provider before I take this medicine? They need to know if you have any of these conditions: -diabetes -gout -immune system problems, like lupus -kidney disease or kidney stones -liver disease -pancreatitis -small amount of urine or difficulty passing urine -an unusual or allergic reaction to hydrochlorothiazide, sulfa drugs, other medicines, foods, dyes, or preservatives -pregnant or trying to get pregnant -breast-feeding How should I use this medicine? Take this medicine by mouth with a glass of water. Follow the directions on the prescription label. Take your medicine at regular intervals. Remember that you will need to pass urine frequently after taking this medicine. Do not take your doses at a time of day that will cause you problems. Do not stop taking your medicine unless your doctor tells you to. Talk to your pediatrician regarding the use of this medicine in children. Special care may be needed. Overdosage: If you think you have taken too much of this medicine contact a poison control center or emergency room at once. NOTE: This medicine is only for you. Do not share this medicine with others. What if I miss a dose? If you miss a dose, take it as soon as you can. If it is almost time for your next dose, take only that  dose. Do not take double or extra doses. What may interact with this medicine? -cholestyramine -colestipol -digoxin -dofetilide -lithium -medicines for blood pressure -medicines for diabetes -medicines that relax muscles for surgery -other diuretics -steroid medicines like prednisone or cortisone This list may not describe all possible interactions. Give your health care provider a list of all the medicines, herbs, non-prescription drugs, or dietary supplements you use. Also tell them if you smoke, drink alcohol, or use illegal drugs. Some items may interact with your medicine. What should I watch for while using this medicine? Visit your doctor or health care professional for regular checks on your progress. Check your blood pressure as directed. Ask your doctor or health care professional what your blood pressure should be and when you should contact him or her. You may need to be on a special diet while taking this medicine. Ask your doctor. Check with your doctor or health care professional if you get an attack of severe diarrhea, nausea and vomiting, or if you sweat a lot. The loss of too much body fluid can make it dangerous for you to take this medicine. You may get drowsy or dizzy. Do not drive, use machinery, or do anything that needs mental alertness until you know how this medicine affects you. Do not stand or sit up quickly, especially if you are an older patient. This reduces the risk of dizzy or fainting spells. Alcohol may interfere with the effect of this medicine. Avoid alcoholic drinks. This medicine may affect your blood sugar level. If you have diabetes, check with your doctor or health care  professional before changing the dose of your diabetic medicine. This medicine can make you more sensitive to the sun. Keep out of the sun. If you cannot avoid being in the sun, wear protective clothing and use sunscreen. Do not use sun lamps or tanning beds/booths. What side effects may I  notice from receiving this medicine? Side effects that you should report to your doctor or health care professional as soon as possible: -allergic reactions such as skin rash or itching, hives, swelling of the lips, mouth, tongue, or throat -changes in vision -chest pain -eye pain -fast or irregular heartbeat -feeling faint or lightheaded, falls -gout attack -muscle pain or cramps -pain or difficulty when passing urine -pain, tingling, numbness in the hands or feet -redness, blistering, peeling or loosening of the skin, including inside the mouth -unusually weak or tired Side effects that usually do not require medical attention (report to your doctor or health care professional if they continue or are bothersome): -change in sex drive or performance -dry mouth -headache -stomach upset This list may not describe all possible side effects. Call your doctor for medical advice about side effects. You may report side effects to FDA at 1-800-FDA-1088. Where should I keep my medicine? Keep out of the reach of children. Store at room temperature between 15 and 30 degrees C (59 and 86 degrees F). Do not freeze. Protect from light and moisture. Keep container closed tightly. Throw away any unused medicine after the expiration date. NOTE: This sheet is a summary. It may not cover all possible information. If you have questions about this medicine, talk to your doctor, pharmacist, or health care provider.  2018 Elsevier/Gold Standard (2010-06-13 12:57:37)

## 2017-01-05 LAB — MICROALBUMIN / CREATININE URINE RATIO
CREATININE, URINE: 122 mg/dL (ref 20–320)
Microalb Creat Ratio: 4 mcg/mg creat (ref <30)
Microalb, Ur: 0.5 mg/dL

## 2017-01-13 ENCOUNTER — Telehealth: Payer: Self-pay

## 2017-01-13 MED FILL — GABAPENTIN 300 MG CAPSULE: 300 | 30 days supply | Qty: 60 | Fill #1

## 2017-01-13 NOTE — Telephone Encounter (Signed)
-----   Message from Lizbeth BarkMandesia R Hairston, [place]NP sent at 01/12/2017  7:30 PM EDT ----- Microalbumin/creatinine ratio level was normal. This tests for protein in your urine that could indicate early signs of kidney damage. Kidney function normal Lipid levels were elevated. This can increase your risk of heart disease overtime. Start eating a diet low in saturated fat. Limit your intake of fried foods, red meats, and whole milk.  Begin exercising at least 3-5 times per week for 30 minutes.

## 2017-01-13 NOTE — Telephone Encounter (Signed)
CMA call to inform patient their results  Patient verify DOB  Patient was aware and understood

## 2017-01-18 ENCOUNTER — Other Ambulatory Visit: Payer: Self-pay | Admitting: Family Medicine

## 2017-01-18 ENCOUNTER — Telehealth: Payer: Self-pay | Admitting: *Deleted

## 2017-01-18 ENCOUNTER — Ambulatory Visit: Payer: Self-pay | Attending: Family Medicine | Admitting: *Deleted

## 2017-01-18 VITALS — BP 130/82

## 2017-01-18 DIAGNOSIS — I10 Essential (primary) hypertension: Secondary | ICD-10-CM

## 2017-01-18 NOTE — Progress Notes (Signed)
Pt here for f/u BP check. Pt denies chest pain, SOB, HA, new vison concerns, or generalized swelling. BP reading manually 130/82. Nurse visit will be routed to provider.Guy Francoravia Tamma Brigandi, RN, BSN

## 2017-01-18 NOTE — Telephone Encounter (Signed)
During nurse visit, pt states she feels that she needs to be back on the stronger dose of gabapentin.  She has been more active and has more nerve pain in left leg, not excluding a fall over the weekend. Pt denies pain in other areas r/t fall.  Before she moved here she states she was taking different dose. Unsure if this was a medication she needed to increase dose slowly. Please advise.

## 2017-01-19 ENCOUNTER — Other Ambulatory Visit: Payer: Self-pay | Admitting: Family Medicine

## 2017-01-19 DIAGNOSIS — R200 Anesthesia of skin: Secondary | ICD-10-CM

## 2017-01-19 DIAGNOSIS — I10 Essential (primary) hypertension: Secondary | ICD-10-CM

## 2017-01-19 DIAGNOSIS — R202 Paresthesia of skin: Principal | ICD-10-CM

## 2017-01-19 MED ORDER — GABAPENTIN 300 MG PO CAPS: 300.0000 mg | ORAL_CAPSULE | Freq: Three times a day (TID) | ORAL | 2 refills | Status: DC

## 2017-01-19 MED ORDER — HYDROCHLOROTHIAZIDE 25 MG PO TABS: 25.0000 mg | ORAL_TABLET | Freq: Every day | ORAL | 2 refills | Status: DC

## 2017-01-19 MED FILL — HYDROCHLOROTHIAZIDE 25 MG T: 25 | 30 days supply | Qty: 30 | Fill #0

## 2017-01-19 NOTE — Telephone Encounter (Signed)
Left message with [facility] personnel to return call. This is the number that is provided in chart. Pt may call back and speak to [place] or Joanna.

## 2017-01-19 NOTE — Telephone Encounter (Signed)
Pt aware of message, verbalized understanding.  Apt scheduled for patient to recheck BP in 2 weeks.

## 2017-01-19 NOTE — Telephone Encounter (Signed)
Increased gabapentin to TID. Increased hydrochlorothiazide (HTCZ) to 25 mg per day for better BP control. BP Goal less than 130/80. Recommend scheduling recheck again with RN in 2 weeks.

## 2017-01-21 MED FILL — GABAPENTIN 300 MG CAPSULE: 300 | 30 days supply | Qty: 90 | Fill #0

## 2017-02-03 ENCOUNTER — Ambulatory Visit: Payer: Self-pay | Attending: Family Medicine | Admitting: *Deleted

## 2017-02-03 VITALS — BP 110/70 | HR 99 | Resp 16

## 2017-02-03 DIAGNOSIS — I10 Essential (primary) hypertension: Secondary | ICD-10-CM

## 2017-02-03 NOTE — Progress Notes (Signed)
Pt here for f/u BP check. Pt denies chest pain, SOB, HA, new vison concerns, or generalized swelling.  BP taken manually in left arm, BP: 110/70 P: 99  Nurse visit will be routed to provider.Guy Franco, RN, BSN

## 2017-02-17 MED FILL — GABAPENTIN 300 MG CAPSULE: 300 | 30 days supply | Qty: 90 | Fill #1

## 2017-03-07 ENCOUNTER — Encounter (HOSPITAL_BASED_OUTPATIENT_CLINIC_OR_DEPARTMENT_OTHER): Payer: Self-pay | Admitting: *Deleted

## 2017-03-07 ENCOUNTER — Emergency Department (HOSPITAL_BASED_OUTPATIENT_CLINIC_OR_DEPARTMENT_OTHER): Payer: Self-pay

## 2017-03-07 ENCOUNTER — Emergency Department (HOSPITAL_BASED_OUTPATIENT_CLINIC_OR_DEPARTMENT_OTHER)
Admission: EM | Admit: 2017-03-07 | Discharge: 2017-03-07 | Disposition: A | Discharge status: Home / Self Care | Payer: Self-pay | Attending: Emergency Medicine | Admitting: Emergency Medicine

## 2017-03-07 DIAGNOSIS — Y929 Unspecified place or not applicable: Secondary | ICD-10-CM | POA: Insufficient documentation

## 2017-03-07 DIAGNOSIS — Y939 Activity, unspecified: Secondary | ICD-10-CM | POA: Insufficient documentation

## 2017-03-07 DIAGNOSIS — Y999 Unspecified external cause status: Secondary | ICD-10-CM | POA: Insufficient documentation

## 2017-03-07 DIAGNOSIS — W010XXA Fall on same level from slipping, tripping and stumbling without subsequent striking against object, initial encounter: Secondary | ICD-10-CM | POA: Insufficient documentation

## 2017-03-07 DIAGNOSIS — F17210 Nicotine dependence, cigarettes, uncomplicated: Secondary | ICD-10-CM | POA: Insufficient documentation

## 2017-03-07 DIAGNOSIS — S838X2A Sprain of other specified parts of left knee, initial encounter: Secondary | ICD-10-CM

## 2017-03-07 DIAGNOSIS — S86812A Strain of other muscle(s) and tendon(s) at lower leg level, left leg, initial encounter: Secondary | ICD-10-CM | POA: Insufficient documentation

## 2017-03-07 DIAGNOSIS — Z79899 Other long term (current) drug therapy: Secondary | ICD-10-CM | POA: Insufficient documentation

## 2017-03-07 DIAGNOSIS — S93492A Sprain of other ligament of left ankle, initial encounter: Secondary | ICD-10-CM | POA: Insufficient documentation

## 2017-03-07 DIAGNOSIS — F17290 Nicotine dependence, other tobacco product, uncomplicated: Secondary | ICD-10-CM | POA: Insufficient documentation

## 2017-03-07 MED ORDER — IBUPROFEN 800 MG PO TABS
800.0000 mg | ORAL_TABLET | Freq: Once | ORAL | Status: AC
  Administered 2017-03-07: 800 mg via ORAL
  Filled 2017-03-07: qty 1

## 2017-03-07 NOTE — ED Notes (Signed)
Ice applied at triage.

## 2017-03-07 NOTE — Discharge Instructions (Signed)
You were seen today for pain in the left knee and ankle. You likely have a bad sprain. Given the history of lymphedema, your symptoms are likely exacerbated. Continue ibuprofen. Rest and ice the ankle as much as possible. Elevate the ankle as well.
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
## 2017-03-07 NOTE — ED Notes (Signed)
PMS intact before and after. Pt tolerated well. All questions answered. 
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
## 2017-03-11 ENCOUNTER — Ambulatory Visit (INDEPENDENT_AMBULATORY_CARE_PROVIDER_SITE_OTHER): Payer: Self-pay | Admitting: Family Medicine

## 2017-03-11 ENCOUNTER — Encounter: Payer: Self-pay | Admitting: Family Medicine

## 2017-03-11 DIAGNOSIS — S99912A Unspecified injury of left ankle, initial encounter: Secondary | ICD-10-CM

## 2017-03-11 DIAGNOSIS — S8992XA Unspecified injury of left lower leg, initial encounter: Secondary | ICD-10-CM

## 2017-03-11 NOTE — Patient Instructions (Signed)
You have an ankle sprain. Ice the area for 15 minutes at a time, 3-4 times a day Aleve 2 tabs twice a day with food OR ibuprofen 3 tabs three times a day with food for pain and inflammation. Ok to take tylenol in addition to this. Elevate above the level of your heart when possible Crutches if needed to help with walking Bear weight as tolerated Use laceup ankle brace to help with stability while you recover from this injury. Come out of the boot/brace twice a day to do Up/down and alphabet exercises 2-3 sets of each. Start theraband strengthening exercises when directed - once a day 3 sets of 10. Consider physical therapy for strengthening and balance exercises. If not improving as expected, we may repeat x-rays or consider further testing like an MRI.  Your knee exam indicates patellar subluxation (where kneecap shifts but doesn't completely dislocate). Knee brace when up and walking around. Icing here also. Same medications as above. Knee extensions, straight leg raises, straight leg raises with foot turned outwards 3 sets of 10 once a day. Add ankle weight if these become too easy. Follow up with me in 2 weeks for reevaluation.

## 2017-03-12 ENCOUNTER — Ambulatory Visit: Payer: Self-pay | Admitting: Family Medicine

## 2017-03-17 DIAGNOSIS — S99912D Unspecified injury of left ankle, subsequent encounter: Secondary | ICD-10-CM | POA: Insufficient documentation

## 2017-03-17 DIAGNOSIS — S8992XD Unspecified injury of left lower leg, subsequent encounter: Secondary | ICD-10-CM | POA: Insufficient documentation

## 2017-03-17 NOTE — Assessment & Plan Note (Signed)
Independently reviewed radiographs and no evidence fracture.  2/2 lateral sprain.  Icing, aleve or ibuprofen with tylenol as needed.  ASO for stability - shown motion exercises to do daily.  Start theraband strengthening exercises when tolerated.  F/u in 2 weeks.

## 2017-03-17 NOTE — Assessment & Plan Note (Signed)
consistent with patellar subluxation but no evidence MPFL tear.  Knee brace for support.  Icing.  Ibuprofen or aleve, tylenol if needed.  Shown home exercises to do daily.  F/u in 2 weeks.

## 2017-03-17 NOTE — Progress Notes (Addendum)
PCP: Lizbeth Bark, FNP  Subjective:   HPI: Patient is a 36 y.o. female here for left knee and ankle injury.  Patient reports on 5/4 she accidentally stepped on a stuffed animal and inverted left ankle. Heard a pop lateral ankle but developed knee pain as well. Pain in ankle has improved more - down to 2/10 level, worse with walking. Pain in knee 6/10 and sharp anterior knee. Radiates to back of knee. Initially couldn't bear weight. Using crutches, ankle brace. Has had swelling ankle and knee. + bruising ankle. No other skin changes.  No numbness.  No past medical history on file.  Current Outpatient Prescriptions on File Prior to Visit  Medication Sig Dispense Refill  . cyclobenzaprine (FLEXERIL) 10 MG tablet Take 1 tablet (10 mg total) by mouth 3 (three) times daily as needed for muscle spasms. 60 tablet 0  . gabapentin (NEURONTIN) 300 MG capsule Take 1 capsule (300 mg total) by mouth 3 (three) times daily. 90 capsule 2  . hydrochlorothiazide (HYDRODIURIL) 25 MG tablet Take 1 tablet (25 mg total) by mouth daily. 30 tablet 2  . norgestimate-ethinyl estradiol (ORTHO-CYCLEN,SPRINTEC,PREVIFEM) 0.25-35 MG-MCG tablet Take 1 tablet by mouth daily.     No current facility-administered medications on file prior to visit.     No past surgical history on file.  Allergies  Allergen Reactions  . Codeine     unknown    Social History   Social History  . Marital status: Single    Spouse name: N/A  . Number of children: N/A  . Years of education: N/A   Occupational History  . Not on file.   Social History Main Topics  . Smoking status: Current Every Day Smoker    Types: E-cigarettes  . Smokeless tobacco: Current User  . Alcohol use No  . Drug use: No     Comment: Previous Heroin OD, took unknown amount of "little blue pills" today  . Sexual activity: Not on file   Other Topics Concern  . Not on file   Social History Narrative  . No narrative on file    No  family history on file.  BP (!) 131/94   Pulse 89   Ht 5\' 2"  (1.575 m)   Wt 140 lb (63.5 kg)   LMP 03/05/2017 (Exact Date)   BMI 25.61 kg/m   Review of Systems: See HPI above.     Objective:  Physical Exam:  Gen: NAD, comfortable in exam room  Left knee: No gross deformity, ecchymoses, effusion. TTP post patellar facets.  No joint line, other tenderness. ROM 0 - 110 degrees. Negative ant/post drawers. Negative valgus/varus testing. Negative lachmanns. Negative mcmurrays, apleys.  Positive patellar apprehension. NV intact distally.  Right knee: FROM without pain.  Left ankle: Mod swelling, some bruising as well anterolateral ankle.  No other deformity. Mod limitation all directions. TTP lateral malleolus and over ATFL.  No other tenderness. 2+ ant drawer and trace talar tilt.   Negative syndesmotic compression. Thompsons test negative. NV intact distally.  Right ankle: FROM without pain.   Assessment & Plan:  1. Left ankle injury - Independently reviewed radiographs and no evidence fracture.  2/2 lateral sprain.  Icing, aleve or ibuprofen with tylenol as needed.  ASO for stability - shown motion exercises to do daily.  Start theraband strengthening exercises when tolerated.  F/u in 2 weeks.  2. Left knee injury - consistent with patellar subluxation but no evidence MPFL tear.  Knee brace for support.  Icing.  Ibuprofen or aleve, tylenol if needed.  Shown home exercises to do daily.  F/u in 2 weeks.

## 2017-03-25 ENCOUNTER — Ambulatory Visit (INDEPENDENT_AMBULATORY_CARE_PROVIDER_SITE_OTHER): Payer: Self-pay | Admitting: Family Medicine

## 2017-03-25 ENCOUNTER — Encounter: Payer: Self-pay | Admitting: Family Medicine

## 2017-03-25 DIAGNOSIS — S99912D Unspecified injury of left ankle, subsequent encounter: Secondary | ICD-10-CM

## 2017-03-25 DIAGNOSIS — S8992XD Unspecified injury of left lower leg, subsequent encounter: Secondary | ICD-10-CM

## 2017-03-25 NOTE — Patient Instructions (Signed)
You have an ankle sprain. Ice the area for 15 minutes at a time, 3-4 times a day Aleve 2 tabs twice a day with food OR ibuprofen 3 tabs three times a day with food for pain and inflammation. Ok to take tylenol in addition to this. Elevate above the level of your heart when possible Bear weight as tolerated Continue with laceup ankle brace when up and walking around. Start physical therapy for this and your knee. Do home exercises on days you don't go to therapy.  Your knee exam indicates patellar subluxation (where kneecap shifts but doesn't completely dislocate). Knee brace when up and walking around. Icing here also. Same medications as above. Start physical therapy and do home exercises on days you don't go to therapy. Add ankle weight if these become too easy. Follow up with me in 1 month for reevaluation.

## 2017-03-30 NOTE — Progress Notes (Signed)
PCP: Lizbeth Bark, FNP  Subjective:   HPI: Patient is a 36 y.o. female here for left knee and ankle injury.  5/10: Patient reports on 5/4 she accidentally stepped on a stuffed animal and inverted left ankle. Heard a pop lateral ankle but developed knee pain as well. Pain in ankle has improved more - down to 2/10 level, worse with walking. Pain in knee 6/10 and sharp anterior knee. Radiates to back of knee. Initially couldn't bear weight. Using crutches, ankle brace. Has had swelling ankle and knee. + bruising ankle. No other skin changes.  No numbness.  5/24: Patient reports mild improvement since last visit. Still having pain especially at work and by end of day. Left knee feels like it will give out posteriorly going up stairs. Pain level up to 4/10 in ankle laterally and anterior left knee. No skin changes, numbness (bruising of ankle has improved). Wearing knee brace and ASO.  No past medical history on file.  Current Outpatient Prescriptions on File Prior to Visit  Medication Sig Dispense Refill  . cyclobenzaprine (FLEXERIL) 10 MG tablet Take 1 tablet (10 mg total) by mouth 3 (three) times daily as needed for muscle spasms. 60 tablet 0  . gabapentin (NEURONTIN) 300 MG capsule Take 1 capsule (300 mg total) by mouth 3 (three) times daily. 90 capsule 2  . hydrochlorothiazide (HYDRODIURIL) 25 MG tablet Take 1 tablet (25 mg total) by mouth daily. 30 tablet 2  . norgestimate-ethinyl estradiol (ORTHO-CYCLEN,SPRINTEC,PREVIFEM) 0.25-35 MG-MCG tablet Take 1 tablet by mouth daily.     No current facility-administered medications on file prior to visit.     No past surgical history on file.  Allergies  Allergen Reactions  . Codeine     unknown    Social History   Social History  . Marital status: Single    Spouse name: N/A  . Number of children: N/A  . Years of education: N/A   Occupational History  . Not on file.   Social History Main Topics  . Smoking  status: Current Every Day Smoker    Types: E-cigarettes  . Smokeless tobacco: Current User  . Alcohol use No  . Drug use: No     Comment: Previous Heroin OD, took unknown amount of "little blue pills" today  . Sexual activity: Not on file   Other Topics Concern  . Not on file   Social History Narrative  . No narrative on file    No family history on file.  BP (!) 149/90   Pulse 95   Ht 5\' 2"  (1.575 m)   Wt 140 lb (63.5 kg)   LMP 03/05/2017 (Exact Date)   BMI 25.61 kg/m   Review of Systems: See HPI above.     Objective:  Physical Exam:  Gen: NAD, comfortable in exam room  Left knee: No gross deformity, ecchymoses, effusion. TTP post patellar facets.  No joint line, other tenderness. ROM 0 - 120 degrees. Negative ant/post drawers. Negative valgus/varus testing. Negative lachmanns. Negative mcmurrays, apleys.  Positive patellar apprehension. NV intact distally.  Right knee: FROM without pain.  Left ankle: Mild swelling laterally.  No bruising, other deformity. Minimal limitation all directions. TTP over ATFL.  No other tenderness. 2+ ant drawer and trace talar tilt.   Negative syndesmotic compression. Thompsons test negative. NV intact distally.  Right ankle: FROM without pain.   Assessment & Plan:  1. Left ankle injury - 2/2 lateral sprain.  Radiographs were negative.  Struggling some - will  add physical therapy.  Icing, aleve or ibuprofen with tylenol as needed.  ASO for stability.  F/u in 1 month.  2. Left knee injury - consistent with patellar subluxation but no evidence MPFL tear.  Knee brace for support.   Icing.  Ibuprofen or aleve, tylenol if needed.  Start physical therapy.  F/u in 1 month.

## 2017-03-30 NOTE — Assessment & Plan Note (Signed)
2/2 lateral sprain.  Radiographs were negative.  Struggling some - will add physical therapy.  Icing, aleve or ibuprofen with tylenol as needed.  ASO for stability.  F/u in 1 month.

## 2017-03-30 NOTE — Assessment & Plan Note (Signed)
consistent with patellar subluxation but no evidence MPFL tear.  Knee brace for support.   Icing.  Ibuprofen or aleve, tylenol if needed.  Start physical therapy.  F/u in 1 month.

## 2017-03-31 ENCOUNTER — Other Ambulatory Visit: Payer: Self-pay | Admitting: Family Medicine

## 2017-03-31 DIAGNOSIS — R202 Paresthesia of skin: Principal | ICD-10-CM

## 2017-03-31 DIAGNOSIS — R200 Anesthesia of skin: Secondary | ICD-10-CM

## 2017-04-22 MED FILL — HYDROCHLOROTHIAZIDE 25 MG T: 25 | 30 days supply | Qty: 30 | Fill #1

## 2017-04-22 MED FILL — GABAPENTIN 300 MG CAPSULE: 300 | 30 days supply | Qty: 90 | Fill #2

## 2017-04-26 ENCOUNTER — Ambulatory Visit: Payer: Self-pay | Admitting: Family Medicine

## 2017-05-20 ENCOUNTER — Ambulatory Visit: Payer: Self-pay | Attending: Family Medicine | Admitting: Family Medicine

## 2017-05-20 ENCOUNTER — Encounter: Payer: Self-pay | Admitting: Family Medicine

## 2017-05-20 ENCOUNTER — Other Ambulatory Visit: Payer: Self-pay | Admitting: Family Medicine

## 2017-05-20 VITALS — BP 128/80 | HR 97 | Temp 98.6°F | Resp 18 | Ht 61.0 in | Wt 133.6 lb

## 2017-05-20 DIAGNOSIS — R200 Anesthesia of skin: Secondary | ICD-10-CM

## 2017-05-20 DIAGNOSIS — I10 Essential (primary) hypertension: Secondary | ICD-10-CM | POA: Insufficient documentation

## 2017-05-20 DIAGNOSIS — M791 Myalgia: Secondary | ICD-10-CM | POA: Insufficient documentation

## 2017-05-20 DIAGNOSIS — R202 Paresthesia of skin: Secondary | ICD-10-CM

## 2017-05-20 DIAGNOSIS — E785 Hyperlipidemia, unspecified: Secondary | ICD-10-CM | POA: Insufficient documentation

## 2017-05-20 DIAGNOSIS — Z13220 Encounter for screening for lipoid disorders: Secondary | ICD-10-CM | POA: Insufficient documentation

## 2017-05-20 DIAGNOSIS — F17200 Nicotine dependence, unspecified, uncomplicated: Secondary | ICD-10-CM | POA: Insufficient documentation

## 2017-05-20 DIAGNOSIS — Z760 Encounter for issue of repeat prescription: Secondary | ICD-10-CM | POA: Insufficient documentation

## 2017-05-20 MED FILL — HYDROCHLOROTHIAZIDE 25 MG T: 25 | 30 days supply | Qty: 30 | Fill #2

## 2017-05-20 MED FILL — GABAPENTIN 300 MG CAPSULE: 300 | 30 days supply | Qty: 90 | Fill #0 | Status: TO

## 2017-05-20 NOTE — Patient Instructions (Signed)

## 2017-05-20 NOTE — Progress Notes (Signed)
Patient wants you to up the gabapentin and she needs a refill on it

## 2017-05-20 NOTE — Progress Notes (Signed)
Subjective:  Patient ID: Yvonne Wagner, female    DOB: 1981-10-31  Age: 36 y.o. MRN: [id number]  CC: Follow-up and Medication Refill   HPI Yvonne Wagner presents for history of hypertension. She is not exercising and is not adherent to low salt diet.  She does not regularly check BP at home. She reports not taking her BP medication daily. Cardiac symptoms none. Patient denies chest pain, chest pressure/discomfort, claudication, dyspnea, lower extremity edema, near-syncope, palpitations and syncope.  Cardiovascular risk factors: dyslipidemia, hypertension and smoking/ tobacco exposure. She reports vaping with 3 mg nicotine daily. Use of agents associated with hypertension: NSAIDS. History of target organ damage: none. History of chronic left lower leg musculoskeletal pain.  for which has been present for several years. Pain is  described as burning, and is intermittent .  Associated symptoms include: deformity.  The patient has tried gabapentin for pain, with moderate relief.  Related to injury:   yes.  Outpatient Medications Prior to Visit  Medication Sig Dispense Refill  . cyclobenzaprine (FLEXERIL) 10 MG tablet Take 1 tablet (10 mg total) by mouth 3 (three) times daily as needed for muscle spasms. 60 tablet 0  . gabapentin (NEURONTIN) 300 MG capsule TAKE 1 CAPSULE BY MOUTH 3 TIMES DAILY 90 capsule 2  . hydrochlorothiazide (HYDRODIURIL) 25 MG tablet TAKE 1 TABLET BY MOUTH DAILY. 30 tablet 2  . norgestimate-ethinyl estradiol (ORTHO-CYCLEN,SPRINTEC,PREVIFEM) 0.25-35 MG-MCG tablet Take 1 tablet by mouth daily.     No facility-administered medications prior to visit.     ROS Review of Systems  Constitutional: Negative.   Eyes: Negative.   Respiratory: Negative.   Cardiovascular: Negative.   Gastrointestinal: Negative.   Musculoskeletal: Positive for myalgias.  Neurological:       Parathesias  Psychiatric/Behavioral: Negative for suicidal ideas.        Objective:  BP 128/80 (BP  Location: Right Arm, Patient Position: Sitting, Cuff Size: Normal)   Pulse 97   Temp 98.6 F (37 C) (Oral)   Resp 18   Ht 5\' 1"  (1.549 m)   Wt 133 lb 9.6 oz (60.6 kg)   LMP 05/12/2017 (Exact Date)   SpO2 97%   BMI 25.24 kg/m   BP/Weight 05/20/2017 03/25/2017 03/11/2017  Systolic BP 128 149 131  Diastolic BP 80 90 94  Wt. (Lbs) 133.6 140 140  BMI 25.24 25.61 25.61     Physical Exam  Eyes: Pupils are equal, round, and reactive to light. Conjunctivae are normal.  Neck: No JVD present.  Cardiovascular: Normal rate, regular rhythm, normal heart sounds and intact distal pulses.   Pulmonary/Chest: Effort normal and breath sounds normal.  Abdominal: Soft. Bowel sounds are normal.  Musculoskeletal:       Left lower leg: She exhibits deformity.  Skin: Skin is warm and dry.  Nursing note and vitals reviewed.   Assessment & Plan:   Problem List Items Addressed This Visit    None    Visit Diagnoses    Medication refill    -  Primary   Refills for and Flexeril were requested prior to office visit and were placed and were placed    Essential hypertension       Encourage adherence with antihypertensive medications.    Schedule BP recheck in 2 weeks with clinic RN.   If BP is greater than 90/60 (MAP 65 or greater) but not less than 130/80 may add dose of lisinopril 5 mg QD    and recheck in another 2 weeks with clinic  RN.   Follow-up with PCP in 3 months.    Screening cholesterol level       Relevant Orders   Lipid Panel (Completed)      No orders of the defined types were placed in this encounter.   Follow-up: Return in about 2 weeks (around 06/03/2017) for BP check with Travia.   Lizbeth BarkMandesia R Takyah Ciaramitaro FNP

## 2017-05-21 LAB — LIPID PANEL
CHOL/HDL RATIO: 4.7 ratio — AB (ref 0.0–4.4)
CHOLESTEROL TOTAL: 189 mg/dL (ref 100–199)
HDL: 40 mg/dL (ref >39)
LDL Calculated: 118 mg/dL — ABNORMAL HIGH (ref 0–99)
TRIGLYCERIDES: 157 mg/dL — AB (ref 0–149)
VLDL Cholesterol Cal: 31 mg/dL (ref 5–40)

## 2017-05-25 ENCOUNTER — Other Ambulatory Visit: Payer: Self-pay | Admitting: Family Medicine

## 2017-05-25 DIAGNOSIS — E782 Mixed hyperlipidemia: Secondary | ICD-10-CM

## 2017-05-25 MED ORDER — PRAVASTATIN SODIUM 10 MG PO TABS: 10.0000 mg | ORAL_TABLET | Freq: Every day | ORAL | 2 refills | Status: AC

## 2017-05-25 MED FILL — PRAVASTATIN NA 10 MG TAB: 10 | 30 days supply | Qty: 30 | Fill #0 | Status: TO

## 2017-05-27 ENCOUNTER — Telehealth: Payer: Self-pay

## 2017-05-27 NOTE — Telephone Encounter (Signed)
-----   Message from Lizbeth BarkMandesia R Hairston, FNP sent at 05/25/2017  3:03 PM EDT ----- -Lipid levels were elevated. This can increase your risk of heart disease. You will be prescribed pravastatin to help lower risk. This can increase your risk of heart disease overtime. Start eating a diet low in saturated fat. Limit your intake of fried foods, red meats, and whole milk. Increase physical activity. Recommend follow up in 3 months.

## 2017-05-27 NOTE — Telephone Encounter (Signed)
CMA call regarding lab results   Patient verify DOB  Patient was aware and understood  

## 2017-06-08 ENCOUNTER — Ambulatory Visit: Payer: Self-pay

## 2017-06-28 ENCOUNTER — Telehealth: Payer: Self-pay

## 2017-06-28 NOTE — Telephone Encounter (Signed)
CMA call regarding requesting birth control refill   Patient has to make a f/up appt for that   Patient was aware and understood

## 2017-06-28 NOTE — Telephone Encounter (Signed)
Cannot refill birth control medication without testing to rule out pregnancy first.

## 2017-06-28 NOTE — Telephone Encounter (Signed)
Pt contacted the office and stated she is currently in [facility] for rehab. Pt states she is unable to get her birth control. Pt is requesting a refill on her birth control medicine if possible. Pt is requesting rx sent to [facility] in [place]. Please f/u

## 2017-07-23 ENCOUNTER — Telehealth: Payer: Self-pay | Admitting: Family Medicine

## 2017-07-23 DIAGNOSIS — I10 Essential (primary) hypertension: Secondary | ICD-10-CM

## 2017-07-23 NOTE — Telephone Encounter (Signed)
[facility] from [facility] called requesting a refill hydrochlorothiazide (HYDRODIURIL) 25 MG tablet Please f/u

## 2017-07-26 MED ORDER — HYDROCHLOROTHIAZIDE 25 MG PO TABS: 25.0000 mg | ORAL_TABLET | Freq: Every day | ORAL | 0 refills | Status: AC

## 2017-07-26 NOTE — Telephone Encounter (Signed)
Refilled x 30 days - must have office visit for any further refills. 
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
